# Patient Record
Sex: Male | Born: 1970 | Race: White | Hispanic: No | Marital: Married | Smoking: Never smoker
Health system: Southern US, Community
[De-identification: ages and names within clinical notes are randomized; demographics above are authoritative.]

## PROBLEM LIST (undated history)

## (undated) DIAGNOSIS — IMO0001 Reserved for inherently not codable concepts without codable children: Secondary | ICD-10-CM

## (undated) DIAGNOSIS — K219 Gastro-esophageal reflux disease without esophagitis: Secondary | ICD-10-CM

---

## 2010-12-06 ENCOUNTER — Emergency Department (HOSPITAL_BASED_OUTPATIENT_CLINIC_OR_DEPARTMENT_OTHER)
Admission: EM | Admit: 2010-12-06 | Discharge: 2010-12-06 | Payer: Self-pay | Source: Home / Self Care | Admitting: Emergency Medicine

## 2015-03-12 ENCOUNTER — Emergency Department (HOSPITAL_BASED_OUTPATIENT_CLINIC_OR_DEPARTMENT_OTHER)
Admission: EM | Admit: 2015-03-12 | Discharge: 2015-03-12 | Disposition: A | Discharge status: Home / Self Care | Payer: BLUE CROSS/BLUE SHIELD | Attending: Emergency Medicine | Admitting: Emergency Medicine

## 2015-03-12 ENCOUNTER — Emergency Department (HOSPITAL_BASED_OUTPATIENT_CLINIC_OR_DEPARTMENT_OTHER): Payer: BLUE CROSS/BLUE SHIELD

## 2015-03-12 ENCOUNTER — Encounter (HOSPITAL_BASED_OUTPATIENT_CLINIC_OR_DEPARTMENT_OTHER): Payer: Self-pay | Admitting: Emergency Medicine

## 2015-03-12 DIAGNOSIS — W312XXA Contact with powered woodworking and forming machines, initial encounter: Secondary | ICD-10-CM | POA: Insufficient documentation

## 2015-03-12 DIAGNOSIS — Y9289 Other specified places as the place of occurrence of the external cause: Secondary | ICD-10-CM | POA: Diagnosis not present

## 2015-03-12 DIAGNOSIS — Y9389 Activity, other specified: Secondary | ICD-10-CM | POA: Diagnosis not present

## 2015-03-12 DIAGNOSIS — Y998 Other external cause status: Secondary | ICD-10-CM | POA: Diagnosis not present

## 2015-03-12 DIAGNOSIS — K219 Gastro-esophageal reflux disease without esophagitis: Secondary | ICD-10-CM | POA: Diagnosis not present

## 2015-03-12 DIAGNOSIS — Z79899 Other long term (current) drug therapy: Secondary | ICD-10-CM | POA: Insufficient documentation

## 2015-03-12 DIAGNOSIS — S61213A Laceration without foreign body of left middle finger without damage to nail, initial encounter: Secondary | ICD-10-CM | POA: Diagnosis not present

## 2015-03-12 DIAGNOSIS — S61219A Laceration without foreign body of unspecified finger without damage to nail, initial encounter: Secondary | ICD-10-CM

## 2015-03-12 HISTORY — DX: Reserved for inherently not codable concepts without codable children: IMO0001

## 2015-03-12 HISTORY — DX: Gastro-esophageal reflux disease without esophagitis: K21.9

## 2015-03-12 MED ORDER — LIDOCAINE HCL (PF) 1 % IJ SOLN
5.0000 mL | Freq: Once | INTRAMUSCULAR | Status: AC
  Administered 2015-03-12: 5 mL
  Filled 2015-03-12: qty 5

## 2015-03-12 MED ORDER — HYDROCODONE-ACETAMINOPHEN 5-325 MG PO TABS: 2.0000 | ORAL_TABLET | ORAL | Status: AC | PRN

## 2015-03-12 MED ORDER — CEPHALEXIN 500 MG PO CAPS: 500.0000 mg | ORAL_CAPSULE | Freq: Three times a day (TID) | ORAL | Status: AC

## 2015-03-12 NOTE — ED Provider Notes (Signed)
CSN: [id number]     Arrival date & time 03/12/15  1557 History   First MD Initiated Contact with Patient 03/12/15 1659     Chief Complaint  Patient presents with  . Finger Lac      (Consider location/radiation/quality/duration/timing/severity/associated sxs/prior Treatment) Patient is a 44 y.o. male presenting with skin laceration. The history is provided by the patient.  Laceration Location:  Hand Hand laceration location:  L finger Depth:  Through underlying tissue Quality: jagged   Bleeding: controlled with pressure   Time since incident: just prior to arrival. Injury mechanism: elctric saw. Pain details:    Quality:  Throbbing   Severity:  Moderate   Timing:  Constant   Progression:  Unchanged Foreign body present:  Unable to specify Relieved by:  Pressure Worsened by:  Nothing tried Tetanus status:  Up to date  Calvin Beck is a 44 y.o. male who presents to the ED with a laceration to his finger he reports using an electric saw and cut his finger.  Past Medical History  Diagnosis Date  . Reflux    History reviewed. No pertinent past surgical history. History reviewed. No pertinent family history. History  Substance Use Topics  . Smoking status: Never Smoker   . Smokeless tobacco: Not on file  . Alcohol Use: No    Review of Systems Negative except as stated in HPI   Allergies  Ciprofloxacin  Home Medications   Prior to Admission medications   Medication Sig Start Date End Date Taking? Authorizing Provider  pantoprazole (PROTONIX) 20 MG tablet Take 20 mg by mouth daily.   Yes Historical Provider, MD  cephALEXin (KEFLEX) 500 MG capsule Take 1 capsule (500 mg total) by mouth 3 (three) times daily. 03/12/15   Calvin Rappaport Orlene OchM [place] Sunday, NP  HYDROcodone-acetaminophen (NORCO/VICODIN) 5-325 MG per tablet Take 2 tablets by mouth every 4 (four) hours as needed. 03/12/15   Calvin Joshua Orlene OchM [place] Osmer, NP   BP 142/89 mmHg  Pulse 93  Temp(Src) 98.2 F (36.8 C) (Oral)  Resp 16  Ht 5\' 11"   (1.803 Beck)  Wt 225 lb (102.059 kg)  BMI 31.39 kg/m2  SpO2 98% Physical Exam  Constitutional: He is oriented to person, place, and time. He appears well-developed and well-nourished.  HENT:  Head: Normocephalic.  Eyes: EOM are normal.  Neck: Neck supple.  Cardiovascular: Normal rate.   Pulmonary/Chest: Effort normal.  Musculoskeletal:       Left hand: He exhibits tenderness and laceration. He exhibits normal range of motion, normal capillary refill and no swelling. Normal sensation noted. Normal strength noted.       Hands: Left middle finger with laceration to the tip, irregular, bleeding controlled with pressure. Good sensation, full range of motion, good strength.   Neurological: He is alert and oriented to person, place, and time. No cranial nerve deficit.  Skin: Skin is warm and dry.  Psychiatric: He has a normal mood and affect. His behavior is normal.  Nursing note and vitals reviewed.   ED Course  LACERATION REPAIR Date/Time: 03/12/2015 6:15 PM Performed by: Calvin Beck, Calvin Beck Authorized by: Calvin Beck, Calvin Beck Consent: Verbal consent obtained. Risks and benefits: risks, benefits and alternatives were discussed Consent given by: patient Patient understanding: patient states understanding of the procedure being performed Patient identity confirmed: verbally with patient Laceration length: 2 cm Tendon involvement: none Nerve involvement: none Vascular damage: no Anesthesia: local infiltration Local anesthetic: lidocaine 2% without epinephrine Anesthetic total: 3 ml Patient sedated: no Preparation: Patient was prepped and  draped in the usual sterile fashion. Irrigation solution: saline Debridement: minimal Degree of undermining: none Skin closure: 4-0 nylon Number of sutures: 4 Approximation: close Approximation difficulty: simple    Dg Finger Middle Left  03/12/2015   CLINICAL DATA:  44 year old male with circular saw soft tissue injury to the distal third finger. Initial  encounter.  EXAM: LEFT MIDDLE FINGER 2+V  COMPARISON:  None.  FINDINGS: No radiopaque foreign body identified. Soft tissue injury along the plantar pad of the left third distal phalanx. Trace subcutaneous gas. Underlying left third phalanges intact. Joint spaces and alignment preserved. No acute osseous abnormality identified.  IMPRESSION: No acute osseous abnormality identified in the left third finger.   Electronically Signed   By: Odessa Fleming Beck.D.   On: 03/12/2015 17:24   X-ray, laceration repair, antibiotics re dirty wound, pain management. F/u one week for suture removal.   MDM  44 y.o. male with laceration to the left middle finger s/p electric saw injury. Stable for d/c without neurovascular deficits. He will follow up in one week with is PCP for suture removal. He will return here as needed for problems. Discussed with the patient and all questioned fully answered. He voices understanding and agrees with plan.  Final diagnoses:  Laceration of finger, initial encounter     [facility], NP 03/14/15 0981  Glynn Octave, MD 03/14/15 [phone]

## 2015-03-12 NOTE — ED Notes (Signed)
Pt states he cut finger on saw

## 2015-03-12 NOTE — Discharge Instructions (Signed)
Take the antibiotic as directed to prevent infection. If you take the pain medication do not drive as it will make you sleepy. Follow up in 7 days for suture removal. Return as needed for problems.

## 2015-08-07 IMAGING — DX DG FINGER MIDDLE 2+V*L*
3 series · 3 of 3 positions shown · non-contrast
Comparison: None.

CLINICAL DATA: 43-year-old male with circular saw soft tissue
injury to the distal third finger. Initial encounter.

EXAM:
LEFT MIDDLE FINGER 2+V

[finger ap]
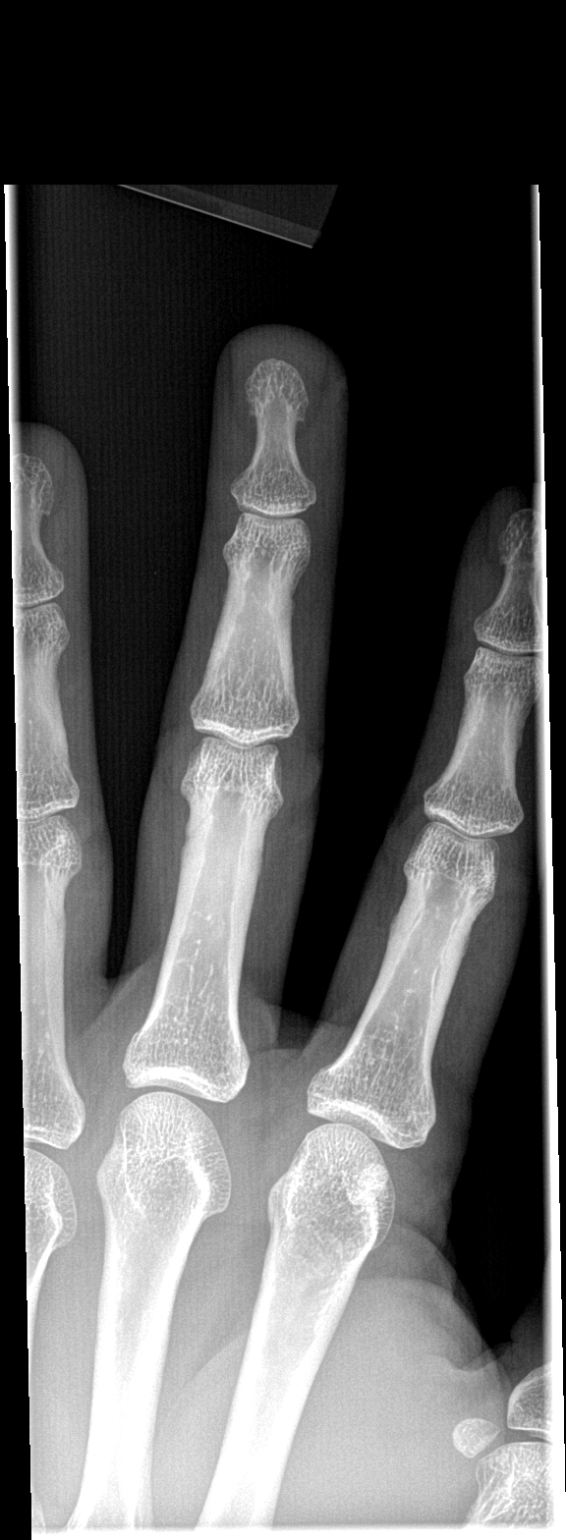

[finger obl]
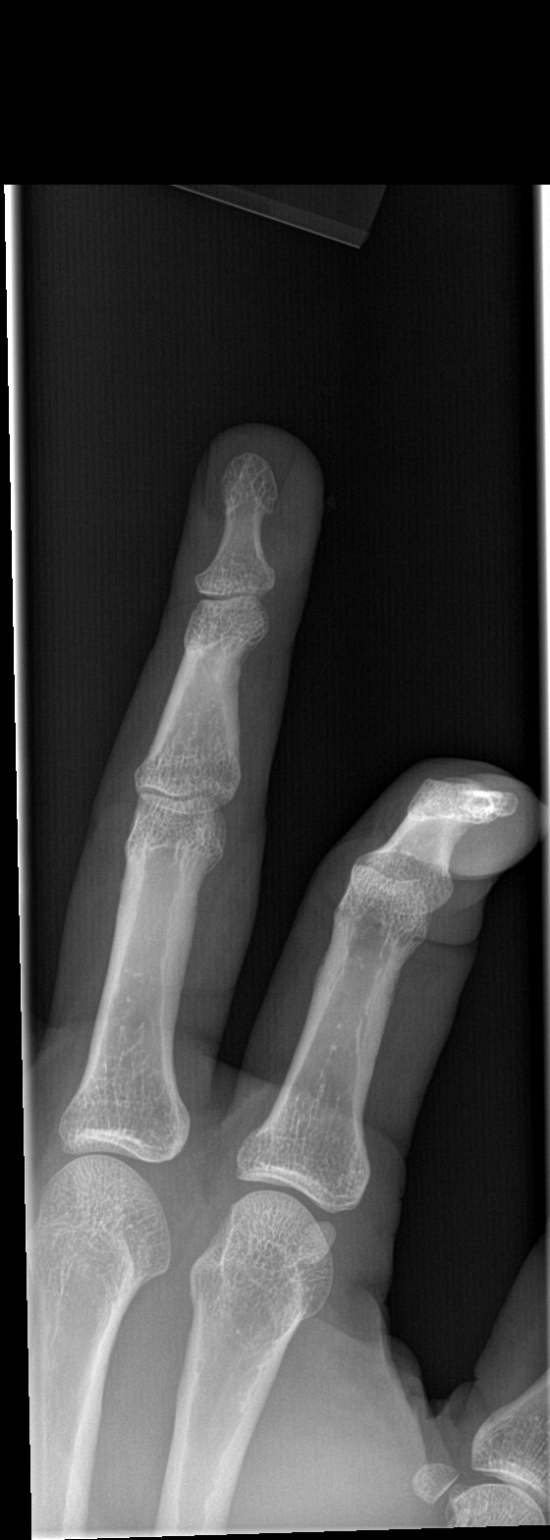

[finger lat]
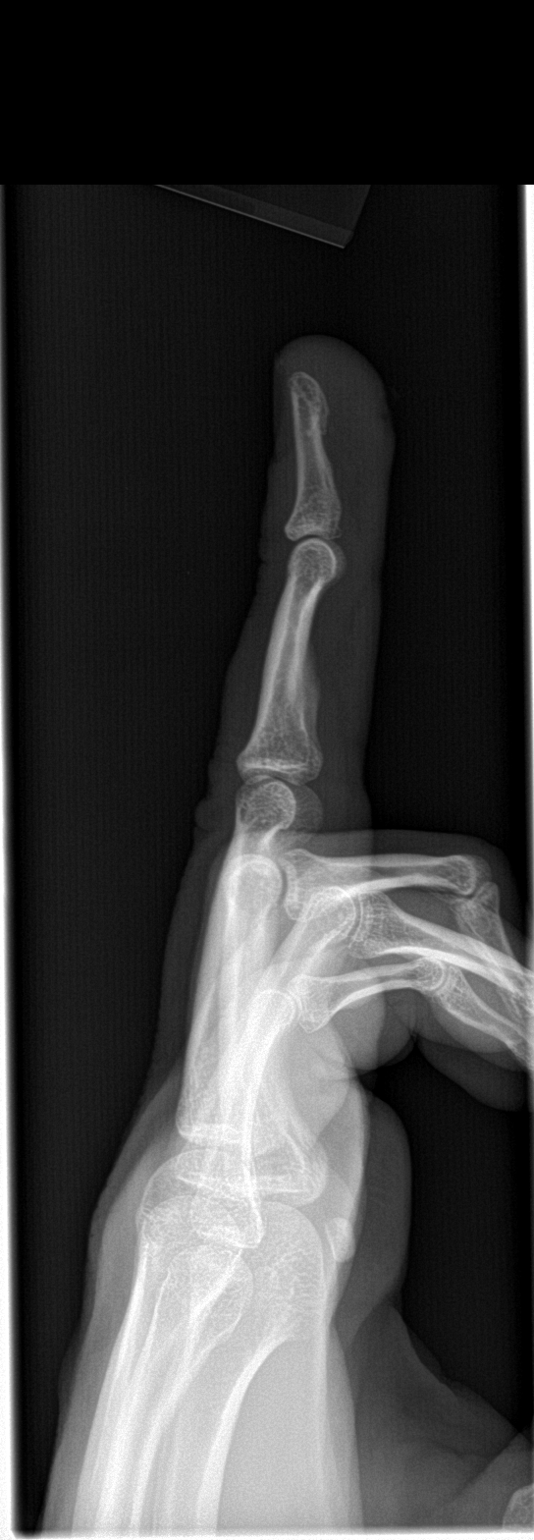

[3 of 3 positions shown; findings below may reference images not displayed]

FINDINGS: No radiopaque foreign body identified. Soft tissue injury along the
plantar pad of the left third distal phalanx. Trace subcutaneous
gas. Underlying left third phalanges intact. Joint spaces and
alignment preserved. No acute osseous abnormality identified.
IMPRESSION: No acute osseous abnormality identified in the left third finger.

## 2021-10-28 ENCOUNTER — Emergency Department (HOSPITAL_BASED_OUTPATIENT_CLINIC_OR_DEPARTMENT_OTHER)
Admission: EM | Admit: 2021-10-28 | Discharge: 2021-10-28 | Disposition: A | Discharge status: Home / Self Care | Payer: BC Managed Care – PPO | Attending: Student | Admitting: Student

## 2021-10-28 ENCOUNTER — Encounter (HOSPITAL_BASED_OUTPATIENT_CLINIC_OR_DEPARTMENT_OTHER): Payer: Self-pay | Admitting: Emergency Medicine

## 2021-10-28 ENCOUNTER — Other Ambulatory Visit: Payer: Self-pay

## 2021-10-28 DIAGNOSIS — K625 Hemorrhage of anus and rectum: Secondary | ICD-10-CM | POA: Diagnosis present

## 2021-10-28 DIAGNOSIS — K921 Melena: Secondary | ICD-10-CM | POA: Diagnosis not present

## 2021-10-28 LAB — OCCULT BLOOD X 1 CARD TO LAB, STOOL: Fecal Occult Bld: POSITIVE — AB

## 2021-10-28 LAB — CBC WITH DIFFERENTIAL/PLATELET
Abs Immature Granulocytes: 0.06 10*3/uL (ref 0.00–0.07)
Basophils Absolute: 0.1 10*3/uL (ref 0.0–0.1)
Basophils Relative: 1 %
Eosinophils Absolute: 0.3 10*3/uL (ref 0.0–0.5)
Eosinophils Relative: 3 %
HCT: 44.9 % (ref 39.0–52.0)
Hemoglobin: 15.2 g/dL (ref 13.0–17.0)
Immature Granulocytes: 1 %
Lymphocytes Relative: 21 %
Lymphs Abs: 2 10*3/uL (ref 0.7–4.0)
MCH: 30 pg (ref 26.0–34.0)
MCHC: 33.9 g/dL (ref 30.0–36.0)
MCV: 88.6 fL (ref 80.0–100.0)
Monocytes Absolute: 0.7 10*3/uL (ref 0.1–1.0)
Monocytes Relative: 7 %
Neutro Abs: 6.5 10*3/uL (ref 1.7–7.7)
Neutrophils Relative %: 67 %
Platelets: 388 10*3/uL (ref 150–400)
RBC: 5.07 MIL/uL (ref 4.22–5.81)
RDW: 12.7 % (ref 11.5–15.5)
WBC: 9.7 10*3/uL (ref 4.0–10.5)
nRBC: 0 % (ref 0.0–0.2)

## 2021-10-28 LAB — COMPREHENSIVE METABOLIC PANEL
ALT: 18 U/L (ref 0–44)
AST: 19 U/L (ref 15–41)
Albumin: 4.9 g/dL (ref 3.5–5.0)
Alkaline Phosphatase: 67 U/L (ref 38–126)
Anion gap: 11 (ref 5–15)
BUN: 12 mg/dL (ref 6–20)
CO2: 27 mmol/L (ref 22–32)
Calcium: 9.3 mg/dL (ref 8.9–10.3)
Chloride: 101 mmol/L (ref 98–111)
Creatinine, Ser: 0.94 mg/dL (ref 0.61–1.24)
GFR, Estimated: >60 mL/min (ref >60)
Glucose, Bld: 91 mg/dL (ref 70–99)
Potassium: 3.8 mmol/L (ref 3.5–5.1)
Sodium: 139 mmol/L (ref 135–145)
Total Bilirubin: 0.7 mg/dL (ref 0.3–1.2)
Total Protein: 7.8 g/dL (ref 6.5–8.1)

## 2021-10-28 MED ORDER — PANTOPRAZOLE SODIUM 40 MG PO TBEC
40.0000 mg | DELAYED_RELEASE_TABLET | Freq: Once | ORAL | Status: AC
  Administered 2021-10-28: 40 mg via ORAL
  Filled 2021-10-28: qty 1

## 2021-10-28 MED ORDER — PANTOPRAZOLE SODIUM 20 MG PO TBEC: 20.0000 mg | DELAYED_RELEASE_TABLET | Freq: Every day | ORAL | 0 refills | Status: AC

## 2021-10-28 NOTE — ED Triage Notes (Signed)
Pt reports bright red blood when wiping since this am and rectal pain; pt reports possible hemorrhoids and hx of same

## 2021-10-28 NOTE — ED Provider Notes (Signed)
[facility] EMERGENCY DEPARTMENT Provider Note   CSN: [id number] Arrival date & time: 10/28/21  1610     History Chief Complaint  Patient presents with   Rectal Bleeding    Calvin Beck is a 50 y.o. male with PMH GERD, external hemorrhoids who presents to the emergency department for evaluation of rectal bleeding.  Patient states that he has had approximately 5 bowel movements today with bright red blood.  He denies any rectal pain, anal trauma.  He states that he has had some rectal bleeding in the past but it was mostly to streaks of blood and he is concerned because today has a large volume of bright red blood.  He denies nausea, vomiting, fever, abdominal pain, headache or other systemic symptoms.  No blood thinner use.  Patient was previously on pantoprazole but is no longer on this medication.  Denies chest pain, shortness of breath, lightheadedness or weakness.   Rectal Bleeding Associated symptoms: no abdominal pain, no fever and no vomiting       Past Medical History:  Diagnosis Date   Reflux     There are no problems to display for this patient.   History reviewed. No pertinent surgical history.     No family history on file.  Social History   Tobacco Use   Smoking status: Never   Smokeless tobacco: Never  Substance Use Topics   Alcohol use: No   Drug use: No    Home Medications Prior to Admission medications   Medication Sig Start Date End Date Taking? Authorizing Provider  cephALEXin (KEFLEX) 500 MG capsule Take 1 capsule (500 mg total) by mouth 3 (three) times daily. 03/12/15   Janne Napoleon, NP  HYDROcodone-acetaminophen (NORCO/VICODIN) 5-325 MG per tablet Take 2 tablets by mouth every 4 (four) hours as needed. 03/12/15   Janne Napoleon, NP  pantoprazole (PROTONIX) 20 MG tablet Take 1 tablet (20 mg total) by mouth daily. 10/28/21   Madisyn Mawhinney, MD    Allergies    Ciprofloxacin  Review of Systems   Review of Systems  Constitutional:   Negative for chills and fever.  HENT:  Negative for ear pain and sore throat.   Eyes:  Negative for pain and visual disturbance.  Respiratory:  Negative for cough and shortness of breath.   Cardiovascular:  Negative for chest pain and palpitations.  Gastrointestinal:  Positive for blood in stool and hematochezia. Negative for abdominal pain and vomiting.  Genitourinary:  Negative for dysuria and hematuria.  Musculoskeletal:  Negative for arthralgias and back pain.  Skin:  Negative for color change and rash.  Neurological:  Negative for seizures and syncope.  All other systems reviewed and are negative.  Physical Exam Updated Vital Signs BP (!) 139/97 (BP Location: Right Arm)   Pulse 84   Temp 98.2 F (36.8 C) (Oral)   Resp 20   Ht 5\' 11"  (1.803 m)   Wt 103 kg   SpO2 99%   BMI 31.66 kg/m   Physical Exam Vitals and nursing note reviewed.  Constitutional:      Appearance: He is well-developed.  HENT:     Head: Normocephalic and atraumatic.  Eyes:     Conjunctiva/sclera: Conjunctivae normal.  Cardiovascular:     Rate and Rhythm: Normal rate and regular rhythm.     Heart sounds: No murmur heard. Pulmonary:     Effort: Pulmonary effort is normal. No respiratory distress.     Breath sounds: Normal breath sounds.  Abdominal:  Palpations: Abdomen is soft.     Tenderness: There is no abdominal tenderness.  Genitourinary:    Rectum: Guaiac result positive.     Comments: External hemorrhoid noted at the 6 o'clock position.  No internal hemorrhoid felt on digital exam, no gross blood on digital exam. Musculoskeletal:     Cervical back: Neck supple.  Skin:    General: Skin is warm and dry.  Neurological:     Mental Status: He is alert.    ED Results / Procedures / Treatments   Labs (all labs ordered are listed, but only abnormal results are displayed) Labs Reviewed  OCCULT BLOOD X 1 CARD TO LAB, STOOL - Abnormal; Notable for the following components:      Result Value    Fecal Occult Bld POSITIVE (*)    All other components within normal limits  CBC WITH DIFFERENTIAL/PLATELET  COMPREHENSIVE METABOLIC PANEL    EKG None  Radiology No results found.  Procedures Procedures   Medications Ordered in ED Medications  pantoprazole (PROTONIX) EC tablet 40 mg (has no administration in time range)    ED Course  I have reviewed the triage vital signs and the nursing notes.  Pertinent labs & imaging results that were available during my care of the patient were reviewed by me and considered in my medical decision making (see chart for details).    MDM Rules/Calculators/A&P                           Patient seen emergency department for evaluation of rectal bleeding.  Physical exam reveals an external hemorrhoid at the 6 o'clock position, no gross blood on digital examination but the patient is Hemoccult positive.  Chemistry LFTs unremarkable, CBC with a normal hemoglobin of 15.2.  Patient hemodynamically stable here in the emergency department he was given single dose oral Protonix.  He has an outpatient gastroenterologist and he will require outpatient follow-up.  At this time, patient does not require hospital admission and he was given strict return precautions which she voiced understanding.  Patient then discharged with outpatient GI follow-up.  Suspect likely internal hemorrhoid. Final Clinical Impression(s) / ED Diagnoses Final diagnoses:  Hematochezia    Rx / DC Orders ED Discharge Orders          Ordered    pantoprazole (PROTONIX) 20 MG tablet  Daily        10/28/21 2010             Katesha Eichel, [place], MD 10/28/21 2016

## 2021-10-28 NOTE — ED Notes (Addendum)
States he has felt full in his abdomen, went to restroom to have a BM, but only blood was noted, states this has occurred approx 5 times today, since this am. States has hx of hemorrhoids, did states he had some light headiness, denies any LOC. Does have some weakness as well. States color of blood varies from bright red to dark red in color.
